# Patient Record
Sex: Male | Born: 1990 | Race: Black or African American | Hispanic: No | Marital: Single | Smoking: Never smoker
Health system: Southern US, Community
[De-identification: ages and names within clinical notes are randomized; demographics above are authoritative.]

---
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
## 2012-04-22 IMAGING — CR RIGHT ANKLE - COMPLETE 3+ VIEW
1 series · 5 of 5 positions shown · non-contrast
Comparison: none

REASON FOR EXAM: injury/pain
COMMENTS:

PROCEDURE:     DXR - DXR ANKLE RIGHT COMPLETE  - June 23, 2010  [DATE]
RESULT:     No fracture, dislocation or other acute bony abnormality is
identified. The ankle mortise is well-maintained.

[Series 1: view not recorded · 0.17mm/px · 5 of 5 slices shown]
[im 1/5]
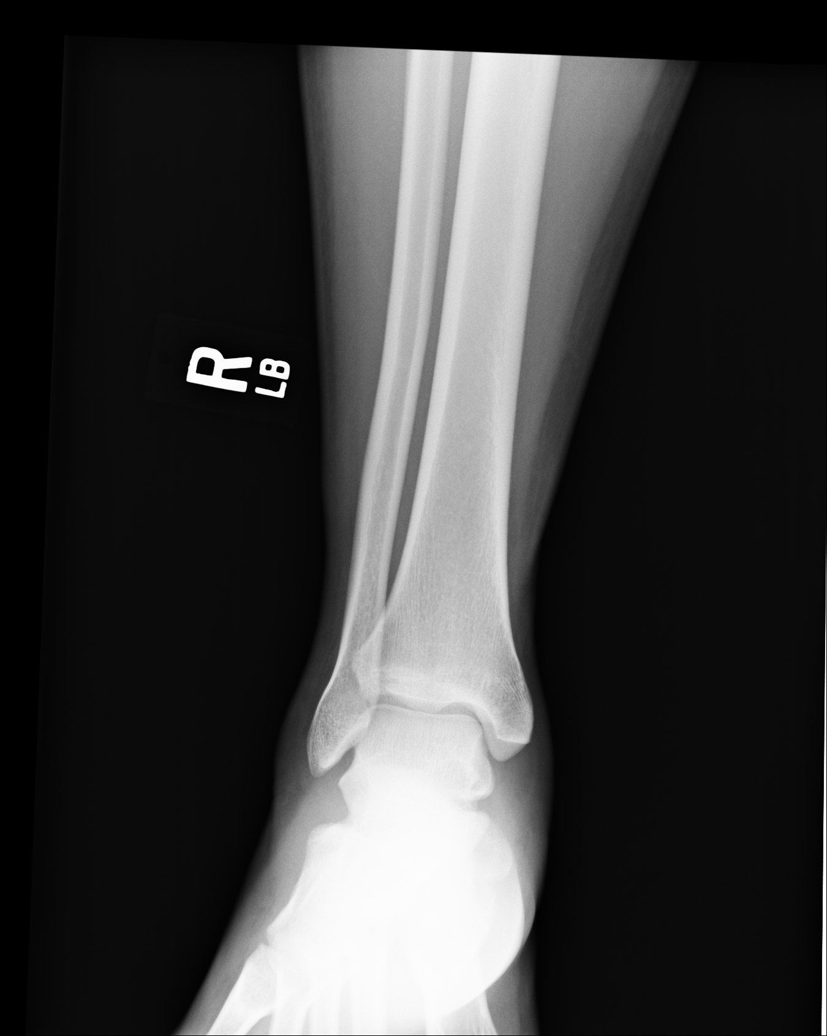
[im 2/5]
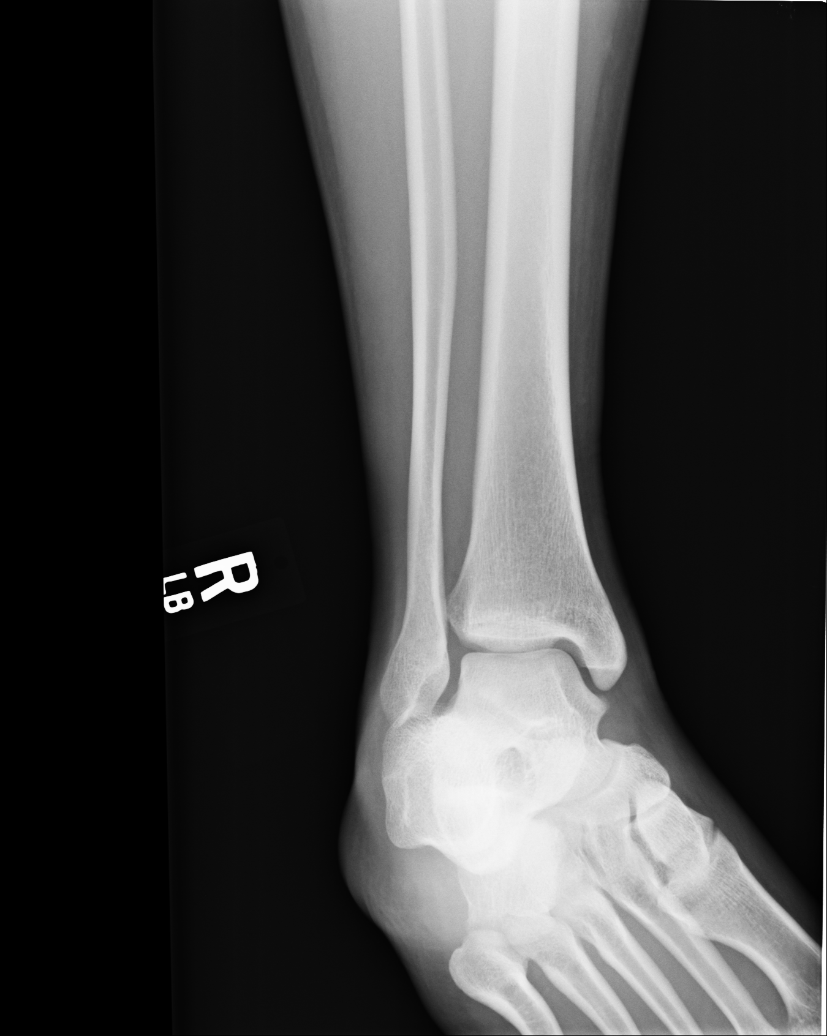
[im 3/5]
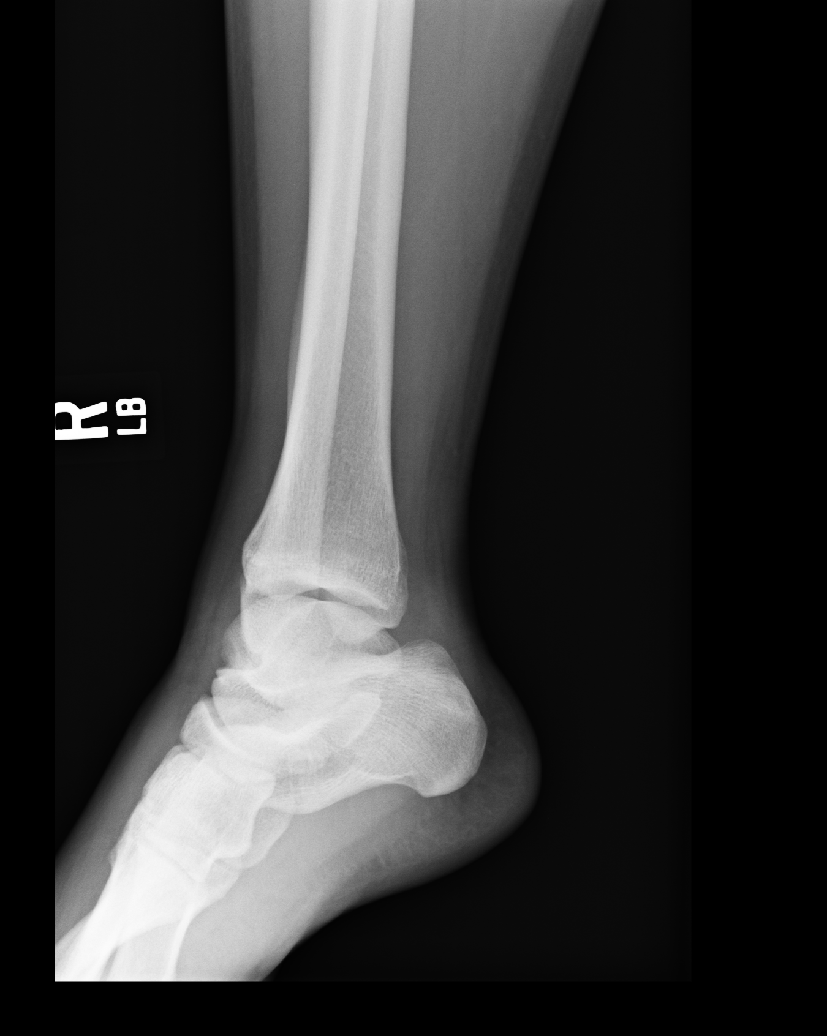
[im 4/5]
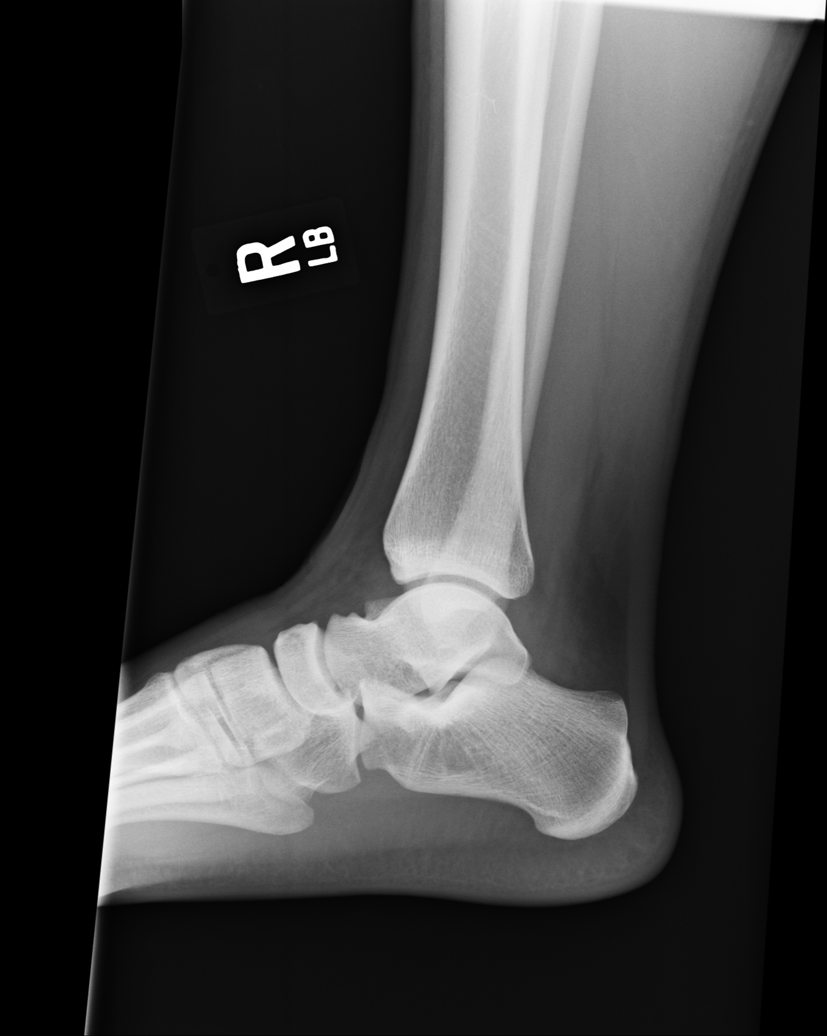
[im 5/5]
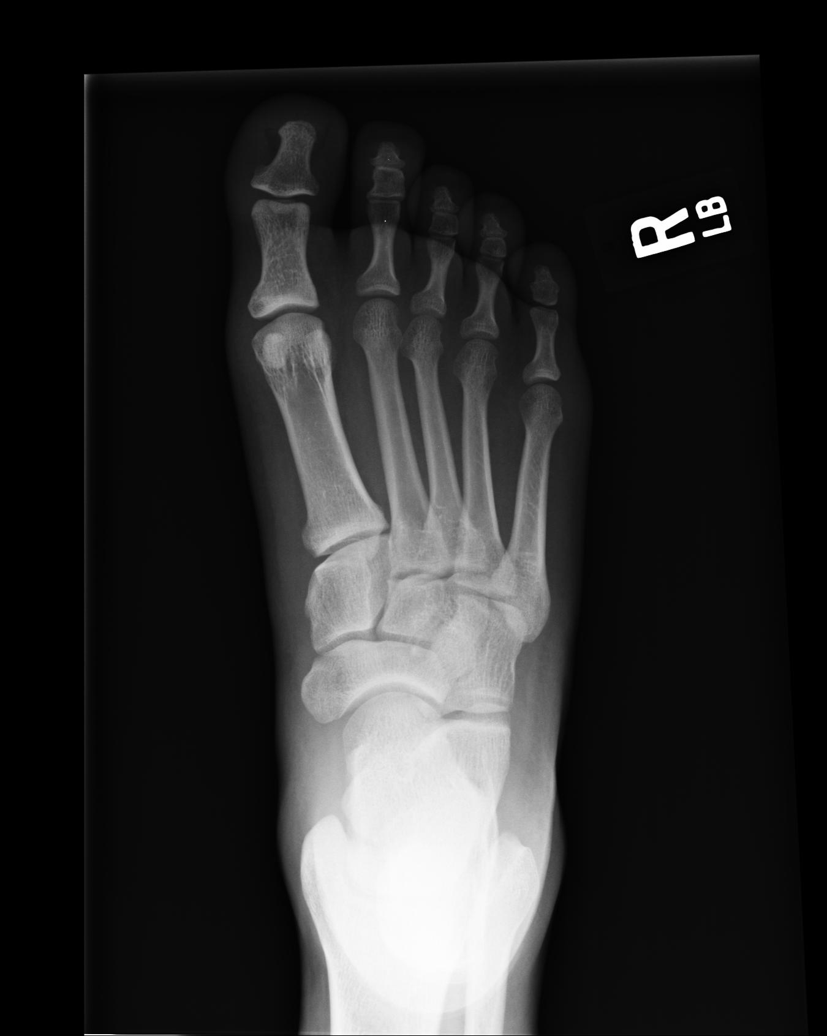

[5 of 5 positions shown; findings below may reference images not displayed]

IMPRESSION: 1.     No significant osseous abnormalities are noted.

## 2012-12-23 ENCOUNTER — Emergency Department: Payer: Self-pay | Admitting: Emergency Medicine

## 2012-12-23 LAB — CBC
HGB: 16.9 g/dL (ref 13.0–18.0)
MCH: 31.4 pg (ref 26.0–34.0)
MCHC: 33.9 g/dL (ref 32.0–36.0)
RBC: 5.38 10*6/uL (ref 4.40–5.90)
RDW: 12.6 % (ref 11.5–14.5)
WBC: 9.6 10*3/uL (ref 3.8–10.6)

## 2012-12-23 LAB — COMPREHENSIVE METABOLIC PANEL
Albumin: 4.7 g/dL (ref 3.4–5.0)
Alkaline Phosphatase: 74 U/L (ref 50–136)
Anion Gap: 2 — ABNORMAL LOW (ref 7–16)
BUN: 19 mg/dL — ABNORMAL HIGH (ref 7–18)
Bilirubin,Total: 0.6 mg/dL (ref 0.2–1.0)
Calcium, Total: 9.1 mg/dL (ref 8.5–10.1)
Chloride: 103 mmol/L (ref 98–107)
Creatinine: 1.01 mg/dL (ref 0.60–1.30)
EGFR (African American): >60
EGFR (Non-African Amer.): >60
Glucose: 85 mg/dL (ref 65–99)
Sodium: 135 mmol/L — ABNORMAL LOW (ref 136–145)

## 2012-12-23 LAB — URINALYSIS, COMPLETE
Bacteria: NONE SEEN
Bilirubin,UR: NEGATIVE
Blood: NEGATIVE
Glucose,UR: NEGATIVE mg/dL (ref 0–75)
Leukocyte Esterase: NEGATIVE
Protein: NEGATIVE
RBC,UR: <1 /HPF (ref 0–5)
WBC UR: 1 /HPF (ref 0–5)

## 2013-09-16 ENCOUNTER — Emergency Department: Payer: Self-pay | Admitting: Emergency Medicine

## 2014-02-03 ENCOUNTER — Emergency Department: Payer: Self-pay | Admitting: Emergency Medicine

## 2015-05-17 ENCOUNTER — Emergency Department
Admission: EM | Admit: 2015-05-17 | Discharge: 2015-05-17 | Disposition: A | Discharge status: Home / Self Care | Payer: Self-pay | Attending: Emergency Medicine | Admitting: Emergency Medicine

## 2015-05-17 DIAGNOSIS — Z720 Tobacco use: Secondary | ICD-10-CM | POA: Insufficient documentation

## 2015-05-17 DIAGNOSIS — Y9389 Activity, other specified: Secondary | ICD-10-CM | POA: Insufficient documentation

## 2015-05-17 DIAGNOSIS — S91332A Puncture wound without foreign body, left foot, initial encounter: Secondary | ICD-10-CM | POA: Insufficient documentation

## 2015-05-17 DIAGNOSIS — Y998 Other external cause status: Secondary | ICD-10-CM | POA: Insufficient documentation

## 2015-05-17 DIAGNOSIS — W450XXA Nail entering through skin, initial encounter: Secondary | ICD-10-CM | POA: Insufficient documentation

## 2015-05-17 DIAGNOSIS — Z23 Encounter for immunization: Secondary | ICD-10-CM | POA: Insufficient documentation

## 2015-05-17 DIAGNOSIS — Y9289 Other specified places as the place of occurrence of the external cause: Secondary | ICD-10-CM | POA: Insufficient documentation

## 2015-05-17 MED ORDER — IBUPROFEN 800 MG PO TABS: 800.0000 mg | ORAL_TABLET | Freq: Three times a day (TID) | ORAL | Status: DC

## 2015-05-17 MED ORDER — CEPHALEXIN 500 MG PO CAPS: 500.0000 mg | ORAL_CAPSULE | Freq: Three times a day (TID) | ORAL | Status: DC

## 2015-05-17 MED ORDER — TETANUS-DIPHTH-ACELL PERTUSSIS 5-2.5-18.5 LF-MCG/0.5 IM SUSP
0.5000 mL | Freq: Once | INTRAMUSCULAR | Status: AC
  Administered 2015-05-17: 0.5 mL via INTRAMUSCULAR
  Filled 2015-05-17: qty 0.5

## 2015-05-17 NOTE — ED Notes (Signed)
Pt states he accidentally stepped on a rusty nail into the left foot/heel yesterday

## 2015-05-17 NOTE — ED Notes (Signed)
Pharmacy called regarding issue btw booster and tdap. Will send someone to ED to inventory the flex fridge in order to fix this issue.

## 2015-05-17 NOTE — ED Provider Notes (Signed)
[facility] Emergency Department Provider Note  ____________________________________________  Time seen:  3:34 PM  I have reviewed the triage vital signs and the nursing notes.   HISTORY  Chief Complaint Puncture Wound   HPI Willie Nelson is a 24 y.o. male seen today after he stepped on a rusty nail and now has a puncture wound to his left foot. He is unsure when last time he had a tetanus shot. He does not have a PCP. When sitting or lying down there is no pain. He states that when he is weightbearing his pain is a 6 or 7 out of 10. He has not taken any over-the-counter medication. He was able to take a shower after this accident and then cleaned it with alcohol and peroxide last evening. He denies any fever or chills. He has not seen any drainage from the area.   History reviewed. No pertinent past medical history.  There are no active problems to display for this patient.   History reviewed. No pertinent past surgical history.  Current Outpatient Rx  Name  Route  Sig  Dispense  Refill  . cephALEXin (KEFLEX) 500 MG capsule   Oral   Take 1 capsule (500 mg total) by mouth 3 (three) times daily.   30 capsule   0   . ibuprofen (ADVIL,MOTRIN) 800 MG tablet   Oral   Take 1 tablet (800 mg total) by mouth 3 (three) times daily.   30 tablet   0     Allergies Review of patient's allergies indicates no known allergies.  No family history on file.  Social History History  Substance Use Topics  . Smoking status: Current Every Day Smoker  . Smokeless tobacco: Never Used  . Alcohol Use: No    Review of Systems Constitutional: No fever/chills ENT: No sore throat. Cardiovascular: Denies chest pain. Respiratory: Denies shortness of breath. Gastrointestinal: No abdominal pain.  No nausea, no vomiting.  Genitourinary: Negative for dysuria. Musculoskeletal: Negative for back pain. Skin: Negative for rash. Neurological: Negative for  headaches 10-point ROS otherwise negative.  ____________________________________________   PHYSICAL EXAM:  VITAL SIGNS: ED Triage Vitals  Enc Vitals Group     BP 05/17/15 1514 137/70 mmHg     Pulse Rate 05/17/15 1514 67     Resp 05/17/15 1514 16     Temp 05/17/15 1514 98.3 F (36.8 C)     Temp Source 05/17/15 1514 Oral     SpO2 05/17/15 1514 100 %     Weight 05/17/15 1514 165 lb (74.844 kg)     Height 05/17/15 1514  (1.651 m)     Head Cir --      Peak Flow --      Pain Score --      Pain Loc --      Pain Edu? --      Excl. in GC? --     Constitutional: Alert and oriented. Well appearing and in no acute distress. Eyes: Conjunctivae are normal. PERRL. EOMI. Head: Atraumatic. Nose: No congestion/rhinnorhea.. Neck: No stridor.   Cardiovascular: Normal rate, regular rhythm. Grossly normal heart sounds.  Good peripheral circulation. Respiratory: Normal respiratory effort.  No retractions. Lungs CTAB. Gastrointestinal: Soft and nontender. No distention. Musculoskeletal: No lower extremity tenderness nor edema.  No joint effusions. Neurologic:  Normal speech and language. No gross focal neurologic deficits are appreciated. No gait instability. Skin:  Skin is warm, dry. There is a single puncture wound to the lateral aspect  of the left heel. There is no edema, redness, or drainage. Psychiatric: Mood and affect are normal. Speech and behavior are normal.  ____________________________________________   LABS (all labs ordered are listed, but only abnormal results are displayed)  Labs Reviewed - No data to display   PROCEDURES  Procedure(s) performed: None  Critical Care performed: No  ____________________________________________   INITIAL IMPRESSION / ASSESSMENT AND PLAN / ED COURSE  Pertinent labs & imaging results that were available during my care of the patient were reviewed by me and considered in my medical decision making (see chart for  details).  Patient was informed of puncture wounds often get infected and was given a prescription for Keflex to begin taking tonight. He is also to continue soaking his foot in warm water 2 times a day for possible. There is possibility of retained material. Patient was told to return if any continued problems or worsening of his symptoms, especially if he begins running fever or drainage from his foot. ____________________________________________   FINAL CLINICAL IMPRESSION(S) / ED DIAGNOSES  Final diagnoses:  Puncture wound of foot, left, initial encounter      Tommi Rumps, PA-C 05/17/15 1649  Emily Filbert, MD 05/18/15 1500

## 2015-05-17 NOTE — Discharge Instructions (Signed)
TAKE ANTIBIOTICS TO PREVENT INFECTION, SOAK FOOT IN WARM WATER IBUPROFEN FOR INFLAMMATION AS NEEDED FOLLOW UP WITH [facility] IF ANY CONTINUED PROBLEMS

## 2018-12-02 ENCOUNTER — Encounter: Payer: Self-pay | Admitting: Emergency Medicine

## 2018-12-02 ENCOUNTER — Other Ambulatory Visit: Payer: Self-pay

## 2018-12-02 ENCOUNTER — Emergency Department
Admission: EM | Admit: 2018-12-02 | Discharge: 2018-12-02 | Disposition: A | Discharge status: Home / Self Care | Payer: Self-pay | Attending: Emergency Medicine | Admitting: Emergency Medicine

## 2018-12-02 DIAGNOSIS — K529 Noninfective gastroenteritis and colitis, unspecified: Secondary | ICD-10-CM | POA: Insufficient documentation

## 2018-12-02 DIAGNOSIS — R197 Diarrhea, unspecified: Secondary | ICD-10-CM | POA: Insufficient documentation

## 2018-12-02 DIAGNOSIS — F17210 Nicotine dependence, cigarettes, uncomplicated: Secondary | ICD-10-CM | POA: Insufficient documentation

## 2018-12-02 DIAGNOSIS — F1210 Cannabis abuse, uncomplicated: Secondary | ICD-10-CM | POA: Insufficient documentation

## 2018-12-02 LAB — COMPREHENSIVE METABOLIC PANEL
ALT: 35 U/L (ref 0–44)
AST: 44 U/L — ABNORMAL HIGH (ref 15–41)
Albumin: 4.2 g/dL (ref 3.5–5.0)
Alkaline Phosphatase: 42 U/L (ref 38–126)
Anion gap: 11 (ref 5–15)
BUN: 18 mg/dL (ref 6–20)
CO2: 24 mmol/L (ref 22–32)
Calcium: 8.7 mg/dL — ABNORMAL LOW (ref 8.9–10.3)
Chloride: 100 mmol/L (ref 98–111)
Creatinine, Ser: 1.09 mg/dL (ref 0.61–1.24)
GFR calc non Af Amer: >60 mL/min (ref >60)
Glucose, Bld: 92 mg/dL (ref 70–99)
Potassium: 4.1 mmol/L (ref 3.5–5.1)
SODIUM: 135 mmol/L (ref 135–145)
Total Bilirubin: 1.1 mg/dL (ref 0.3–1.2)
Total Protein: 7.1 g/dL (ref 6.5–8.1)

## 2018-12-02 LAB — CBC
HEMATOCRIT: 50.9 % (ref 39.0–52.0)
HEMOGLOBIN: 17.9 g/dL — AB (ref 13.0–17.0)
MCH: 31.6 pg (ref 26.0–34.0)
MCHC: 35.2 g/dL (ref 30.0–36.0)
MCV: 89.9 fL (ref 80.0–100.0)
Platelets: 122 10*3/uL — ABNORMAL LOW (ref 150–400)
RBC: 5.66 MIL/uL (ref 4.22–5.81)
RDW: 11.7 % (ref 11.5–15.5)
WBC: 4.1 10*3/uL (ref 4.0–10.5)
nRBC: 0 % (ref 0.0–0.2)

## 2018-12-02 LAB — URINALYSIS, COMPLETE (UACMP) WITH MICROSCOPIC
Bilirubin Urine: NEGATIVE
Glucose, UA: NEGATIVE mg/dL
Hgb urine dipstick: NEGATIVE
Ketones, ur: 80 mg/dL — AB
Leukocytes,Ua: NEGATIVE
NITRITE: NEGATIVE
Protein, ur: 30 mg/dL — AB
Specific Gravity, Urine: 1.031 — ABNORMAL HIGH (ref 1.005–1.030)
pH: 5 (ref 5.0–8.0)

## 2018-12-02 LAB — LIPASE, BLOOD: Lipase: 31 U/L (ref 11–51)

## 2018-12-02 MED ORDER — SODIUM CHLORIDE 0.9 % IV BOLUS
1000.0000 mL | Freq: Once | INTRAVENOUS | Status: AC
  Administered 2018-12-02: 1000 mL via INTRAVENOUS

## 2018-12-02 MED ORDER — ONDANSETRON HCL 4 MG/2ML IJ SOLN
4.0000 mg | Freq: Once | INTRAMUSCULAR | Status: AC | PRN
  Administered 2018-12-02: 4 mg via INTRAVENOUS
  Filled 2018-12-02: qty 2

## 2018-12-02 MED ORDER — SODIUM CHLORIDE 0.9% FLUSH: 3.0000 mL | Freq: Once | INTRAVENOUS | Status: DC

## 2018-12-02 MED ORDER — ONDANSETRON HCL 4 MG PO TABS: 4.0000 mg | ORAL_TABLET | Freq: Three times a day (TID) | ORAL | 0 refills | Status: DC | PRN

## 2018-12-02 NOTE — ED Triage Notes (Signed)
Nausea, vomiting and diarrhea x 3 days. Denies abdominal pain.

## 2018-12-02 NOTE — ED Notes (Signed)
PO challenged pt successfully

## 2018-12-02 NOTE — ED Provider Notes (Signed)
[facility] Emergency Department Provider Note  ____________________________________________   I have reviewed the triage vital signs and the nursing notes. Where available I have reviewed prior notes and, if possible and indicated, outside hospital notes.    HISTORY  Chief Complaint Emesis and Diarrhea    HPI Willie Nelson is a 28 y.o. male who states he is healthy, is had nausea vomiting diarrhea for last 3 days.  No melena no bright red blood per rectum no hematemesis, he states that he has no abdominal pain, he was feeling somewhat dehydrated so he came in.  He is not actually vomited since this morning and he has not had any diarrhea for most of the day.  He states that the diarrhea he had is getting to be more solid and he feels that he is mostly over this.  No sick contacts of any significance no recent travel no recent antibiotics no recent camping, no focal abdominal pain or abdominal pain of any variety, he states that he got a liter of fluid from triage and as long as he can drink he wants to go home he feels much better he does not want any more fluids.  No other alleviating or aggravating factors no other prior treatment no other complaints   History reviewed. No pertinent past medical history.  There are no active problems to display for this patient.   History reviewed. No pertinent surgical history.  Prior to Admission medications   Medication Sig Start Date End Date Taking? Authorizing Provider  cephALEXin (KEFLEX) 500 MG capsule Take 1 capsule (500 mg total) by mouth 3 (three) times daily. 05/17/15   Tommi Rumps, PA-C  ibuprofen (ADVIL,MOTRIN) 800 MG tablet Take 1 tablet (800 mg total) by mouth 3 (three) times daily. 05/17/15   Tommi Rumps, PA-C    Allergies Patient has no known allergies.  No family history on file.  Social History Social History   Tobacco Use  . Smoking status: Current Every Day Smoker  . Smokeless  tobacco: Never Used  Substance Use Topics  . Alcohol use: No  . Drug use: Yes    Types: Marijuana    Review of Systems Constitutional: No fever/chills Eyes: No visual changes. ENT: No sore throat. No stiff neck no neck pain Cardiovascular: Denies chest pain. Respiratory: Denies shortness of breath. Gastrointestinal:   no vomiting.  No diarrhea.  No constipation. Genitourinary: Negative for dysuria. Musculoskeletal: Negative lower extremity swelling Skin: Negative for rash. Neurological: Negative for severe headaches, focal weakness or numbness.   ____________________________________________   PHYSICAL EXAM:  VITAL SIGNS: ED Triage Vitals  Enc Vitals Group     BP 12/02/18 1544 121/81     Pulse Rate 12/02/18 1544 77     Resp 12/02/18 1544 20     Temp 12/02/18 1544 97.9 F (36.6 C)     Temp Source 12/02/18 1544 Oral     SpO2 12/02/18 1544 100 %     Weight 12/02/18 1545 162 lb (73.5 kg)     Height 12/02/18 1545 5\' 6"  (1.676 m)     Head Circumference --      Peak Flow --      Pain Score 12/02/18 1545 0     Pain Loc --      Pain Edu? --      Excl. in GC? --     Constitutional: Alert and oriented. Well appearing and in no acute distress.  Forward watching TV and in  no acute distress Eyes: Conjunctivae are normal Head: Atraumatic HEENT: No congestion/rhinnorhea. Mucous membranes are moist.  Oropharynx non-erythematous Neck:   Nontender with no meningismus, no masses, no stridor Cardiovascular: Normal rate, regular rhythm. Grossly normal heart sounds.  Good peripheral circulation. Respiratory: Normal respiratory effort.  No retractions. Lungs CTAB. Abdominal: Soft and nontender. No distention. No guarding no rebound Back:  There is no focal tenderness or step off.  there is no midline tenderness there are no lesions noted. there is no CVA tenderness Musculoskeletal: No lower extremity tenderness, no upper extremity tenderness. No joint effusions, no DVT signs strong  distal pulses no edema Neurologic:  Normal speech and language. No gross focal neurologic deficits are appreciated.  Skin:  Skin is warm, dry and intact. No rash noted. Psychiatric: Mood and affect are normal. Speech and behavior are normal.  ____________________________________________   LABS (all labs ordered are listed, but only abnormal results are displayed)  Labs Reviewed  CBC - Abnormal; Notable for the following components:      Result Value   Hemoglobin 17.9 (*)    Platelets 122 (*)    All other components within normal limits  URINALYSIS, COMPLETE (UACMP) WITH MICROSCOPIC - Abnormal; Notable for the following components:   Color, Urine YELLOW (*)    APPearance CLEAR (*)    Specific Gravity, Urine 1.031 (*)    Ketones, ur 80 (*)    Protein, ur 30 (*)    Bacteria, UA RARE (*)    All other components within normal limits  COMPREHENSIVE METABOLIC PANEL - Abnormal; Notable for the following components:   Calcium 8.7 (*)    AST 44 (*)    All other components within normal limits  LIPASE, BLOOD    Pertinent labs  results that were available during my care of the patient were reviewed by me and considered in my medical decision making (see chart for details). ____________________________________________  EKG  I personally interpreted any EKGs ordered by me or triage  ____________________________________________  RADIOLOGY  Pertinent labs & imaging results that were available during my care of the patient were reviewed by me and considered in my medical decision making (see chart for details). If possible, patient and/or family made aware of any abnormal findings.  No results found. ____________________________________________    PROCEDURES  Procedure(s) performed: None  Procedures  Critical Care performed: None  ____________________________________________   INITIAL IMPRESSION / ASSESSMENT AND PLAN / ED COURSE  Pertinent labs & imaging results that were  available during my care of the patient were reviewed by me and considered in my medical decision making (see chart for details).  Patient here with nausea vomiting and diarrhea, states that he has been having this for couple days but he is much better today, he did receive a liter of fluid.  He clearly was somewhat depleted intravascularly given the ketosis in his urine but no evidence of other acute pathology and his abdomen on serial exams is completely benign.  For this reason, we will discharge him at his request if he can tolerate p.o.  Did offer him more IV fluid given the 80+ ketones and he declined states if he can hydrate he wants to go home and watch his own TV instead of ours which I do not think is unreasonable.    ____________________________________________   FINAL CLINICAL IMPRESSION(S) / ED DIAGNOSES  Final diagnoses:  None      This chart was dictated using voice recognition software.  Despite best efforts  to proofread,  errors can occur which can change meaning.      Jeanmarie Plant, MD 12/02/18 [phone]

## 2020-06-02 ENCOUNTER — Encounter: Payer: Self-pay | Admitting: Emergency Medicine

## 2020-06-02 DIAGNOSIS — Y9289 Other specified places as the place of occurrence of the external cause: Secondary | ICD-10-CM | POA: Insufficient documentation

## 2020-06-02 DIAGNOSIS — W57XXXA Bitten or stung by nonvenomous insect and other nonvenomous arthropods, initial encounter: Secondary | ICD-10-CM | POA: Insufficient documentation

## 2020-06-02 DIAGNOSIS — Y9389 Activity, other specified: Secondary | ICD-10-CM | POA: Insufficient documentation

## 2020-06-02 DIAGNOSIS — F17210 Nicotine dependence, cigarettes, uncomplicated: Secondary | ICD-10-CM | POA: Insufficient documentation

## 2020-06-02 DIAGNOSIS — R109 Unspecified abdominal pain: Secondary | ICD-10-CM | POA: Insufficient documentation

## 2020-06-02 DIAGNOSIS — R2232 Localized swelling, mass and lump, left upper limb: Secondary | ICD-10-CM | POA: Insufficient documentation

## 2020-06-02 DIAGNOSIS — Z23 Encounter for immunization: Secondary | ICD-10-CM | POA: Insufficient documentation

## 2020-06-02 DIAGNOSIS — S50862A Insect bite (nonvenomous) of left forearm, initial encounter: Secondary | ICD-10-CM | POA: Insufficient documentation

## 2020-06-02 DIAGNOSIS — Y998 Other external cause status: Secondary | ICD-10-CM | POA: Insufficient documentation

## 2020-06-02 LAB — URINALYSIS, COMPLETE (UACMP) WITH MICROSCOPIC
Bacteria, UA: NONE SEEN
Bilirubin Urine: NEGATIVE
Glucose, UA: 50 mg/dL — AB
Hgb urine dipstick: NEGATIVE
Ketones, ur: 5 mg/dL — AB
Nitrite: NEGATIVE
Protein, ur: 100 mg/dL — AB
Specific Gravity, Urine: 1.03 (ref 1.005–1.030)
pH: 5 (ref 5.0–8.0)

## 2020-06-02 LAB — COMPREHENSIVE METABOLIC PANEL
ALT: 16 U/L (ref 0–44)
AST: 19 U/L (ref 15–41)
Albumin: 4.4 g/dL (ref 3.5–5.0)
Alkaline Phosphatase: 45 U/L (ref 38–126)
Anion gap: 9 (ref 5–15)
BUN: 15 mg/dL (ref 6–20)
CO2: 28 mmol/L (ref 22–32)
Calcium: 9 mg/dL (ref 8.9–10.3)
Chloride: 104 mmol/L (ref 98–111)
Creatinine, Ser: 1.08 mg/dL (ref 0.61–1.24)
GFR calc Af Amer: >60 mL/min (ref >60)
GFR calc non Af Amer: >60 mL/min (ref >60)
Glucose, Bld: 108 mg/dL — ABNORMAL HIGH (ref 70–99)
Potassium: 3.8 mmol/L (ref 3.5–5.1)
Sodium: 141 mmol/L (ref 135–145)
Total Bilirubin: 0.8 mg/dL (ref 0.3–1.2)
Total Protein: 7 g/dL (ref 6.5–8.1)

## 2020-06-02 LAB — CBC
HCT: 50.2 % (ref 39.0–52.0)
Hemoglobin: 17.1 g/dL — ABNORMAL HIGH (ref 13.0–17.0)
MCH: 32.1 pg (ref 26.0–34.0)
MCHC: 34.1 g/dL (ref 30.0–36.0)
MCV: 94.4 fL (ref 80.0–100.0)
Platelets: 212 10*3/uL (ref 150–400)
RBC: 5.32 MIL/uL (ref 4.22–5.81)
RDW: 12.4 % (ref 11.5–15.5)
WBC: 15.2 10*3/uL — ABNORMAL HIGH (ref 4.0–10.5)
nRBC: 0 % (ref 0.0–0.2)

## 2020-06-02 LAB — LIPASE, BLOOD: Lipase: 33 U/L (ref 11–51)

## 2020-06-02 NOTE — ED Triage Notes (Signed)
Pt c/o abdominal pain x1 hour, no N/V/D. Pt also want to have insect bite on the left forearm checked out. Area is red around bite.

## 2020-06-03 ENCOUNTER — Emergency Department
Admission: EM | Admit: 2020-06-03 | Discharge: 2020-06-03 | Disposition: A | Discharge status: Home / Self Care | Payer: Self-pay | Attending: Emergency Medicine | Admitting: Emergency Medicine

## 2020-06-03 DIAGNOSIS — S50862A Insect bite (nonvenomous) of left forearm, initial encounter: Secondary | ICD-10-CM

## 2020-06-03 DIAGNOSIS — W57XXXA Bitten or stung by nonvenomous insect and other nonvenomous arthropods, initial encounter: Secondary | ICD-10-CM

## 2020-06-03 MED ORDER — TETANUS-DIPHTH-ACELL PERTUSSIS 5-2.5-18.5 LF-MCG/0.5 IM SUSP
0.5000 mL | Freq: Once | INTRAMUSCULAR | Status: AC
  Administered 2020-06-03: 0.5 mL via INTRAMUSCULAR
  Filled 2020-06-03: qty 0.5

## 2020-06-03 MED ORDER — SULFAMETHOXAZOLE-TRIMETHOPRIM 800-160 MG PO TABS: 1.0000 | ORAL_TABLET | Freq: Two times a day (BID) | ORAL | 0 refills | Status: DC

## 2020-06-03 MED ORDER — CEPHALEXIN 500 MG PO CAPS: 500.0000 mg | ORAL_CAPSULE | Freq: Three times a day (TID) | ORAL | 0 refills | Status: DC

## 2020-06-03 NOTE — ED Provider Notes (Signed)
[facility] Emergency Department Provider Note  ____________________________________________  Time seen: Approximately 8:14 AM  I have reviewed the triage vital signs and the nursing notes.   HISTORY  Chief Complaint Insect Bite and Abdominal Pain    HPI Willie Nelson is a 29 y.o. male with no significant past medical history who comes the ED complaining of left forearm pain and swelling since about 8:00 PM last night, gradual onset, continuous, no aggravating or alleviating factors.  Nonradiating.  No weakness paresthesia pallor or coldness in the distal arm.  Associated with intermittent abdominal cramp without nausea vomiting or diarrhea.  Started after sustaining some sort of insect bite while working on his car under a tree.  He did not see any insects or snakes, but noted pain and small puncture marks on the arm.      History reviewed. No pertinent past medical history.   There are no problems to display for this patient.    History reviewed. No pertinent surgical history.   Prior to Admission medications   Medication Sig Start Date End Date Taking? Authorizing Provider  cephALEXin (KEFLEX) 500 MG capsule Take 1 capsule (500 mg total) by mouth 3 (three) times daily. 06/03/20   Sharman Cheek, MD  ondansetron (ZOFRAN) 4 MG tablet Take 1 tablet (4 mg total) by mouth every 8 (eight) hours as needed for nausea or vomiting. 12/02/18   Jeanmarie Plant, MD  sulfamethoxazole-trimethoprim (BACTRIM DS) 800-160 MG tablet Take 1 tablet by mouth 2 (two) times daily. 06/03/20   Sharman Cheek, MD     Allergies Patient has no known allergies.   History reviewed. No pertinent family history.  Social History Social History   Tobacco Use  . Smoking status: Current Every Day Smoker  . Smokeless tobacco: Never Used  Substance Use Topics  . Alcohol use: No  . Drug use: Yes    Types: Marijuana    Review of Systems  Constitutional:   No  fever or chills.  ENT:   No sore throat. No rhinorrhea. Cardiovascular:   No chest pain or syncope. Respiratory:   No dyspnea or cough. Gastrointestinal: Positive as above for intermittent abdominal pain without vomiting and diarrhea.  Musculoskeletal:   Left arm pain and swelling as above All other systems reviewed and are negative except as documented above in ROS and HPI.  ____________________________________________   PHYSICAL EXAM:  VITAL SIGNS: ED Triage Vitals  Enc Vitals Group     BP 06/02/20 2150 (!) 105/55     Pulse Rate 06/02/20 2150 (!) 54     Resp 06/02/20 2150 16     Temp 06/02/20 2206 97.7 F (36.5 C)     Temp Source 06/02/20 2206 Axillary     SpO2 06/02/20 2150 100 %     Weight 06/02/20 2156 160 lb (72.6 kg)     Height 06/02/20 2156 5\' 7"  (1.702 m)     Head Circumference --      Peak Flow --      Pain Score --      Pain Loc --      Pain Edu? --      Excl. in GC? --     Vital signs reviewed, nursing assessments reviewed.   Constitutional:   Alert and oriented. Non-toxic appearance. Eyes:   Conjunctivae are normal. EOMI.  ENT      Head:   Normocephalic and atraumatic.      Nose:   Wearing a mask.  Mouth/Throat:   Wearing a mask.      Neck:   No meningismus. Full ROM. Hematological/Lymphatic/Immunilogical:   No cervical lymphadenopathy. Cardiovascular:   RRR. Symmetric bilateral radial and DP pulses.  No murmurs. Cap refill less than 2 seconds. Respiratory:   Normal respiratory effort without tachypnea/retractions. Breath sounds are clear and equal bilaterally. No wheezes/rales/rhonchi. Gastrointestinal:   Soft and nontender. Non distended. There is no CVA tenderness.  No rebound, rigidity, or guarding.  Musculoskeletal:   Normal range of motion in all extremities. No joint effusions.  There are 3 small pustules on the left volar forearm proximally in a cluster, a few millimeters each in size, not draining.  The seated on a approximately 3 cm area of  mild swelling and erythema.  No fluctuance or crepitus.  Not significantly tender or indurated.  Neurologic:   Normal speech and language.  Motor grossly intact. No acute focal neurologic deficits are appreciated.  Skin:    Skin is warm, dry and intact. No rash noted.  No petechiae, purpura, or bullae.  ____________________________________________    LABS (pertinent positives/negatives) (all labs ordered are listed, but only abnormal results are displayed) Labs Reviewed  COMPREHENSIVE METABOLIC PANEL - Abnormal; Notable for the following components:      Result Value   Glucose, Bld 108 (*)    All other components within normal limits  CBC - Abnormal; Notable for the following components:   WBC 15.2 (*)    Hemoglobin 17.1 (*)    All other components within normal limits  URINALYSIS, COMPLETE (UACMP) WITH MICROSCOPIC - Abnormal; Notable for the following components:   Color, Urine AMBER (*)    APPearance CLOUDY (*)    Glucose, UA 50 (*)    Ketones, ur 5 (*)    Protein, ur 100 (*)    Leukocytes,Ua TRACE (*)    All other components within normal limits  LIPASE, BLOOD   ____________________________________________   EKG    ____________________________________________    RADIOLOGY  No results found.  ____________________________________________   PROCEDURES Procedures  ____________________________________________    CLINICAL IMPRESSION / ASSESSMENT AND PLAN / ED COURSE  Medications ordered in the ED: Medications  Tdap (BOOSTRIX) injection 0.5 mL (0.5 mLs Intramuscular Given 06/03/20 0814)    Pertinent labs & imaging results that were available during my care of the patient were reviewed by me and considered in my medical decision making (see chart for details).  Willie Nelson was evaluated in Emergency Department on 06/03/2020 for the symptoms described in the history of present illness. He was evaluated in the context of the global COVID-19 pandemic,  which necessitated consideration that the patient might be at risk for infection with the SARS-CoV-2 virus that causes COVID-19. Institutional protocols and algorithms that pertain to the evaluation of patients at risk for COVID-19 are in a state of rapid change based on information released by regulatory bodies including the CDC and federal and state organizations. These policies and algorithms were followed during the patient's care in the ED.   Patient presents with pain and swelling of the left forearm, found to have some mild inflammatory changes and pustular eruption.  Suspect spider bite, without evidence of severe toxicity.  Will treat with Keflex and Bactrim, update tetanus.      ____________________________________________   FINAL CLINICAL IMPRESSION(S) / ED DIAGNOSES    Final diagnoses:  Insect bite of left forearm, initial encounter     ED Discharge Orders  Ordered    sulfamethoxazole-trimethoprim (BACTRIM DS) 800-160 MG tablet  2 times daily     Discontinue  Reprint     06/03/20 0813    cephALEXin (KEFLEX) 500 MG capsule  3 times daily     Discontinue  Reprint     06/03/20 0813          Portions of this note were generated with dragon dictation software. Dictation errors may occur despite best attempts at proofreading.   Sharman Cheek, MD 06/03/20 [phone]

## 2022-02-22 ENCOUNTER — Ambulatory Visit
Admission: EM | Admit: 2022-02-22 | Discharge: 2022-02-22 | Disposition: A | Discharge status: Home / Self Care | Payer: Self-pay | Attending: Internal Medicine | Admitting: Internal Medicine

## 2022-02-22 DIAGNOSIS — H6121 Impacted cerumen, right ear: Secondary | ICD-10-CM | POA: Insufficient documentation

## 2022-02-22 DIAGNOSIS — L02413 Cutaneous abscess of right upper limb: Secondary | ICD-10-CM | POA: Insufficient documentation

## 2022-02-22 MED ORDER — DOXYCYCLINE HYCLATE 100 MG PO CAPS: 100.0000 mg | ORAL_CAPSULE | Freq: Two times a day (BID) | ORAL | 0 refills | Status: AC

## 2022-02-22 NOTE — ED Provider Notes (Signed)
?[facility] ? ? ? ?CSN: [id number] ?Arrival date & time: 02/22/22  1807 ? ? ?  ? ?History   ?Chief Complaint ?Chief Complaint  ?Patient presents with  ? Insect Bite  ? ? ?HPI ?Willie Nelson is a 31 y.o. male who presents with insect bite on his R elbow x 2 days ? ?2- would like his R ear checked, it itches him a lot, more on the anterior or the canal ? ?History reviewed. No pertinent past medical history. ? ?There are no problems to display for this patient. ? ? ?History reviewed. No pertinent surgical history. ? ? ? ? ?Home Medications   ? ?Prior to Admission medications   ?Medication Sig Start Date End Date Taking? Authorizing Provider  ?doxycycline (VIBRAMYCIN) 100 MG capsule Take 1 capsule (100 mg total) by mouth 2 (two) times daily. 02/22/22  Yes Rodriguez-Southworth, Nettie Elm, PA-C  ? ? ?Family History ?History reviewed. No pertinent family history. ? ?Social History ?Social History  ? ?Tobacco Use  ? Smoking status: Never  ? Smokeless tobacco: Never  ?Vaping Use  ? Vaping Use: Every day  ?Substance Use Topics  ? Alcohol use: Yes  ? Drug use: Yes  ?  Types: Marijuana  ? ? ? ?Allergies   ?Patient has no known allergies. ? ? ?Review of Systems ?Review of Systems  ?Constitutional:  Negative for fever.  ?HENT:  Negative for congestion, ear discharge and ear pain.   ?     Itching R ear  ?Respiratory:  Negative for cough.   ?Musculoskeletal:  Negative for myalgias.  ?Skin:   ?     Abscess R arm  ? ? ?Physical Exam ?Triage Vital Signs ?ED Triage Vitals  ?Enc Vitals Group  ?   BP 02/22/22 1833 119/75  ?   Pulse Rate 02/22/22 1833 60  ?   Resp 02/22/22 1833 18  ?   Temp 02/22/22 1833 98.4 ?F (36.9 ?C)  ?   Temp Source 02/22/22 1833 Oral  ?   SpO2 02/22/22 1833 100 %  ?   Weight 02/22/22 1831 163 lb (73.9 kg)  ?   Height 02/22/22 1831 5\' 6"  (1.676 m)  ?   Head Circumference --   ?   Peak Flow --   ?   Pain Score 02/22/22 1831 0  ?   Pain Loc --   ?   Pain Edu? --   ?   Excl. in GC? --   ? ?No data  found. ? ?Updated Vital Signs ?BP 119/75 (BP Location: Left Arm)   Pulse 60   Temp 98.4 ?F (36.9 ?C) (Oral)   Resp 18   Ht 5\' 6"  (1.676 m)   Wt 163 lb (73.9 kg)   SpO2 100%   BMI 26.31 kg/m?  ? ?Visual Acuity ?Right Eye Distance:   ?Left Eye Distance:   ?Bilateral Distance:   ? ?Right Eye Near:   ?Left Eye Near:    ?Bilateral Near:    ? ?Physical Exam ?Vitals and nursing note reviewed.  ?Constitutional:   ?   General: He is not in acute distress. ?   Appearance: He is normal weight. He is not toxic-appearing.  ?HENT:  ?   Right Ear: Tympanic membrane and ear canal normal.  ?   Left Ear: Tympanic membrane, ear canal and external ear normal.  ?   Ears:  ?   Comments: Has small piece of wax on the anterior ear canal ?Eyes:  ?  Conjunctiva/sclera: Conjunctivae normal.  ?Pulmonary:  ?   Effort: Pulmonary effort is normal.  ?Musculoskeletal:     ?   General: Normal range of motion.  ?   Cervical back: Neck supple.  ?Skin: ?   General: Skin is warm and dry.  ?   Findings: No rash.  ?   Comments: R FOREARM- with 1.5 cm x 1.5 cm induration with 2 elongated vesicles on the skin. Area is little warm and red.   ?Neurological:  ?   Mental Status: He is alert and oriented to person, place, and time.  ?   Gait: Gait normal.  ?Psychiatric:     ?   Mood and Affect: Mood normal.     ?   Behavior: Behavior normal.     ?   Thought Content: Thought content normal.     ?   Judgment: Judgment normal.  ? ? ? ?UC Treatments / Results  ?Labs ?(all labs ordered are listed, but only abnormal results are displayed) ?Labs Reviewed  ?AEROBIC CULTURE W GRAM STAIN (SUPERFICIAL SPECIMEN)  ? ? ?EKG ? ? ?Radiology ?No results found. ? ?Procedures ?Incision and Drainage ? ?Date/Time: 02/22/2022 7:19 PM ?Performed by: Garey Ham, PA-C ?Authorized by: Garey Ham, PA-C  ? ?Consent:  ?  Consent obtained:  Verbal ?  Consent given by:  Patient ?Location:  ?  Type:  Bulla ?  Size:  45mm x 4 mm ?  Location:  Upper  extremity ?  Upper extremity location:  Arm ?  Arm location:  R lower arm ?Pre-procedure details:  ?  Procedure prep: alcohol pad. ?Procedure type:  ?  Complexity:  Simple ?Procedure details:  ?  Drainage:  Serosanguinous ?  Drainage amount:  Scant ?Post-procedure details:  ?  Procedure completion:  Tolerated ?Comments:  ?   A Band-Aid applied over the area (including critical care time) ? ?Medications Ordered in UC ?Medications - No data to display ? ?Initial Impression / Assessment and Plan / UC Course  ?I have reviewed the triage vital signs and the nursing notes. ? ?R arm abscess ?Itching R ear, maybe is the wax on the area of the itching.  ? ?R ear was was washed out ?I sent a wound culture ?I placed him on Doxy as noted.  ?We will call him if we need to change his antibiotic.  ? ?Final diagnoses:  ?Cutaneous abscess of right upper extremity  ?Cerumen debris on tympanic membrane of right ear  ? ?Discharge Instructions   ?None ?  ? ?ED Prescriptions   ? ? Medication Sig Dispense Auth. Provider  ? doxycycline (VIBRAMYCIN) 100 MG capsule Take 1 capsule (100 mg total) by mouth 2 (two) times daily. 20 capsule Rodriguez-Southworth, Nettie Elm, PA-C  ? ?  ? ?PDMP not reviewed this encounter. ?  ?Garey Ham, PA-C ?02/22/22 1920 ? ?

## 2022-02-22 NOTE — ED Triage Notes (Signed)
Pt c/o spider bite along the right elbow x2days. ? ? Pt does not know when he was bitten. Pt scratched at the spot this morning and states that it has swollen up after being at work.  ? ?Pt put salt on the insect bite.  ? ?Pt states that today it has swollen and gotten "bubbled up" ?

## 2022-02-25 LAB — AEROBIC CULTURE W GRAM STAIN (SUPERFICIAL SPECIMEN)
Culture: NO GROWTH
Gram Stain: NONE SEEN
Special Requests: NORMAL
